# Patient Record
Sex: Male | Born: 1980 | Race: White | Hispanic: No | Marital: Married | Smoking: Current every day smoker
Health system: Southern US, Community
[De-identification: ages and names within clinical notes are randomized; demographics above are authoritative.]

## PROBLEM LIST (undated history)

## (undated) DIAGNOSIS — I499 Cardiac arrhythmia, unspecified: Secondary | ICD-10-CM

## (undated) DIAGNOSIS — R011 Cardiac murmur, unspecified: Secondary | ICD-10-CM

## (undated) DIAGNOSIS — A630 Anogenital (venereal) warts: Secondary | ICD-10-CM

## (undated) HISTORY — DX: Cardiac murmur, unspecified: R01.1

## (undated) HISTORY — DX: Anogenital (venereal) warts: A63.0

## (undated) HISTORY — DX: Cardiac arrhythmia, unspecified: I49.9

---

## 2013-07-26 ENCOUNTER — Encounter: Payer: Self-pay | Admitting: Internal Medicine

## 2013-07-26 ENCOUNTER — Ambulatory Visit (INDEPENDENT_AMBULATORY_CARE_PROVIDER_SITE_OTHER): Payer: Managed Care, Other (non HMO) | Admitting: Internal Medicine

## 2013-07-26 VITALS — BP 110/64 | HR 83 | Temp 98.8°F | Ht 72.5 in | Wt 177.0 lb

## 2013-07-26 DIAGNOSIS — Z0000 Encounter for general adult medical examination without abnormal findings: Secondary | ICD-10-CM

## 2013-07-26 NOTE — Patient Instructions (Addendum)

## 2013-07-26 NOTE — Progress Notes (Signed)
Pre visit review using our clinic review tool, if applicable. No additional management support is needed unless otherwise documented below in the visit note. 

## 2013-07-26 NOTE — Progress Notes (Signed)
HPI  Pt presents to the clinic today to establish care. Steven Hart not had a PCP in many years. Steven does reports Steven Hart an issue with his heart. 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 Steven was told at that time Steven had a Grade 4 systolic murmur. Steven was referred to a cardiologist but never went to the appointment. A few weeks ago, Steven was going to annual training for the [employer]. Steven was sprinting and about 1 minute after, Steven did feel like his heart was racing. Steven ended up going to the ER. His heart rate was elevated in the 180's. Steven does report that Steven had a elevated troponin, creatinine. Chest xray was normal. Steven was again referred to cardiology. Steven Hart an appointment in 2 weeks. Steven Hart not had another episode of heart racing since that time.  Flu: 11/2012 Tetanus: unsure Dentist: as needed  Past Medical History  Diagnosis Date  . Heart murmur   . Genital warts   . Arrhythmia     Current Outpatient Prescriptions  Medication Sig Dispense Refill  . Multiple Vitamin (MULTIVITAMIN) tablet Take 1 tablet by mouth daily.       No current facility-administered medications for this visit.    No Known Allergies  History reviewed. No pertinent family history.  History   Social History  . Marital Status: Married    Spouse Name: N/A    Number of Children: N/A  . Years of Education: N/A   Occupational History  . Not on file.   Social History Main Topics  . Smoking status: Current Every Day Smoker -- 1.00 packs/day    Types: Cigarettes  . Smokeless tobacco: Never Used  . Alcohol Use: Yes     Comment: moderate  . Drug Use: No  . Sexual Activity: Not on file   Other Topics Concern  . Not on file   Social History Narrative  . No narrative on file    ROS:  Constitutional: Denies fever, malaise, fatigue, headache or abrupt weight changes.  HEENT: Denies eye pain, eye redness, ear pain, ringing in the ears, wax buildup, runny nose, nasal congestion, bloody nose, or  sore throat. Respiratory: Denies difficulty breathing, shortness of breath, cough or sputum production.   Cardiovascular: Denies chest pain, chest tightness, palpitations or swelling in the hands or feet.  Gastrointestinal: Denies abdominal pain, bloating, constipation, diarrhea or blood in the stool.  GU: Denies frequency, urgency, pain with urination, blood in urine, odor or discharge. Musculoskeletal: Denies decrease in range of motion, difficulty with gait, muscle pain or joint pain and swelling.  Skin: Denies redness, rashes, lesions or ulcercations.  Neurological: Denies dizziness, difficulty with memory, difficulty with speech or problems with balance and coordination.   No other specific complaints in a complete review of systems (except as listed in HPI above).  PE:  BP 110/64  Pulse 83  Temp(Src) 98.8 F (37.1 C) (Oral)  Ht 6' 0.5" (1.842 m)  Wt 177 lb (80.287 kg)  BMI 23.66 kg/m2  SpO2 98% Wt Readings from Last 3 Encounters:  07/26/13 177 lb (80.287 kg)    General: Appears his stated age, well developed, well nourished in NAD. HEENT: Head: normal shape and size; Eyes: sclera white, no icterus, conjunctiva pink, PERRLA and EOMs intact; Ears: Tm's gray and intact, normal light reflex; Nose: mucosa pink and moist, septum midline; Throat/Mouth: Teeth present, mucosa pink and moist, no lesions or ulcerations noted.  Neck: Normal range of  motion. Neck supple, trachea midline. No massses, lumps or thyromegaly present.  Cardiovascular: Normal rate and rhythm. S1,S2 noted.  No murmur, rubs or gallops noted. No JVD or BLE edema. No carotid bruits noted. Pulmonary/Chest: Normal effort and positive vesicular breath sounds. No respiratory distress. No wheezes, rales or ronchi noted.  Abdomen: Soft and nontender. Normal bowel sounds, no bruits noted. No distention or masses noted. Liver, spleen and kidneys non palpable. Musculoskeletal: Normal range of motion. No signs of joint swelling.  No difficulty with gait.  Neurological: Alert and oriented. Cranial nerves II-XII intact. Coordination normal. +DTRs bilaterally. Psychiatric: Mood and affect normal. Behavior is normal. Judgment and thought content normal.      Assessment and Plan:  Preventative Health Maintenance:  Will obtain basic screening labs today  Heart Murmur and Arrythmia:  Hart an upcoming appt with cardiology  RTC in 1 year or sooner if needed

## 2013-07-27 LAB — COMPREHENSIVE METABOLIC PANEL
ALBUMIN: 5.1 g/dL (ref 3.5–5.2)
ALT: 16 U/L (ref 0–53)
AST: 18 U/L (ref 0–37)
Alkaline Phosphatase: 45 U/L (ref 39–117)
BUN: 13 mg/dL (ref 6–23)
CO2: 26 mEq/L (ref 19–32)
Calcium: 9.6 mg/dL (ref 8.4–10.5)
Chloride: 107 mEq/L (ref 96–112)
Creatinine, Ser: 1 mg/dL (ref 0.4–1.5)
GFR: 91.8 mL/min (ref >60.00)
Glucose, Bld: 65 mg/dL — ABNORMAL LOW (ref 70–99)
POTASSIUM: 4.1 meq/L (ref 3.5–5.1)
Sodium: 139 mEq/L (ref 135–145)
Total Bilirubin: 0.5 mg/dL (ref 0.2–1.2)
Total Protein: 7.4 g/dL (ref 6.0–8.3)

## 2013-07-27 LAB — CBC
HCT: 44.6 % (ref 39.0–52.0)
Hemoglobin: 15.2 g/dL (ref 13.0–17.0)
MCHC: 34.2 g/dL (ref 30.0–36.0)
MCV: 94.3 fl (ref 78.0–100.0)
PLATELETS: 278 10*3/uL (ref 150.0–400.0)
RBC: 4.73 Mil/uL (ref 4.22–5.81)
RDW: 12.9 % (ref 11.5–15.5)
WBC: 8.4 10*3/uL (ref 4.0–10.5)

## 2013-07-27 LAB — LIPID PANEL
CHOL/HDL RATIO: 4
Cholesterol: 178 mg/dL (ref 0–200)
HDL: 49.3 mg/dL (ref >39.00)
LDL Cholesterol: 104 mg/dL — ABNORMAL HIGH (ref 0–99)
NonHDL: 128.7
Triglycerides: 122 mg/dL (ref 0.0–149.0)
VLDL: 24.4 mg/dL (ref 0.0–40.0)

## 2016-10-20 ENCOUNTER — Encounter: Payer: Self-pay | Admitting: Emergency Medicine

## 2016-10-20 ENCOUNTER — Emergency Department
Admission: EM | Admit: 2016-10-20 | Discharge: 2016-10-20 | Disposition: A | Discharge status: Home / Self Care | Payer: Managed Care, Other (non HMO) | Attending: Emergency Medicine | Admitting: Emergency Medicine

## 2016-10-20 ENCOUNTER — Emergency Department: Payer: Managed Care, Other (non HMO)

## 2016-10-20 DIAGNOSIS — Z79899 Other long term (current) drug therapy: Secondary | ICD-10-CM | POA: Insufficient documentation

## 2016-10-20 DIAGNOSIS — M6789 Other specified disorders of synovium and tendon, multiple sites: Secondary | ICD-10-CM

## 2016-10-20 DIAGNOSIS — S61214A Laceration without foreign body of right ring finger without damage to nail, initial encounter: Secondary | ICD-10-CM | POA: Insufficient documentation

## 2016-10-20 DIAGNOSIS — Y929 Unspecified place or not applicable: Secondary | ICD-10-CM | POA: Diagnosis not present

## 2016-10-20 DIAGNOSIS — S61411A Laceration without foreign body of right hand, initial encounter: Secondary | ICD-10-CM

## 2016-10-20 DIAGNOSIS — W269XXA Contact with unspecified sharp object(s), initial encounter: Secondary | ICD-10-CM | POA: Insufficient documentation

## 2016-10-20 DIAGNOSIS — M6788 Other specified disorders of synovium and tendon, other site: Secondary | ICD-10-CM | POA: Insufficient documentation

## 2016-10-20 DIAGNOSIS — Y999 Unspecified external cause status: Secondary | ICD-10-CM | POA: Insufficient documentation

## 2016-10-20 DIAGNOSIS — Y93G2 Activity, grilling and smoking food: Secondary | ICD-10-CM | POA: Diagnosis not present

## 2016-10-20 DIAGNOSIS — F17210 Nicotine dependence, cigarettes, uncomplicated: Secondary | ICD-10-CM | POA: Insufficient documentation

## 2016-10-20 MED ORDER — LIDOCAINE HCL 1 % IJ SOLN
5.0000 mL | Freq: Once | INTRAMUSCULAR | Status: DC
  Filled 2016-10-20: qty 5

## 2016-10-20 MED ORDER — CEPHALEXIN 500 MG PO CAPS: 500.0000 mg | ORAL_CAPSULE | Freq: Four times a day (QID) | ORAL | 0 refills | Status: AC

## 2016-10-20 MED ORDER — LIDOCAINE HCL (PF) 1 % IJ SOLN
INTRAMUSCULAR | Status: AC
  Filled 2016-10-20: qty 5

## 2016-10-20 NOTE — ED Provider Notes (Signed)
[facility] Emergency Department Provider Note  ____________________________________________  Time seen: Approximately 10:30 PM  I have reviewed the triage vital signs and the nursing notes.   HISTORY  Chief Complaint Laceration     HPI Steven Hart is a 36 y.o. male presenting to the emergency department with a linear, 8 cm laceration along the ulnar aspect of the right ring finger. Patient states that he sustained laceration while cooking on a charcoal grill this evening. Hemostasis was achieved prior to presenting to the emergency department. Patient denies weakness, radiculopathy or changes in sensation of the right upper extremity. Patient confides that he is under the influence of alcohol.    Past Medical History:  Diagnosis Date  . Arrhythmia   . Genital warts   . Heart murmur     There are no active problems to display for this patient.   History reviewed. No pertinent surgical history.  Prior to Admission medications   Medication Sig Start Date End Date Taking? Authorizing Provider  cephALEXin (KEFLEX) 500 MG capsule Take 1 capsule (500 mg total) by mouth 4 (four) times daily. 10/20/16 10/30/16  Orvil FeilWoods, Jaclyn M, PA-C  Multiple Vitamin (MULTIVITAMIN) tablet Take 1 tablet by mouth daily.    [provider]    Allergies Patient has no known allergies.  Family History  Problem Relation Age of Onset  . Heart disease Maternal Grandmother   . Heart disease Maternal Grandfather   . Cancer Neg Hx   . Diabetes Neg Hx   . Stroke Neg Hx     Social History Social History  Substance Use Topics  . Smoking status: Current Every Day Smoker    Packs/day: 1.00    Types: Cigarettes  . Smokeless tobacco: Never Used  . Alcohol use 0.6 oz/week    1 Cans of beer per week     Comment: moderate     Review of Systems  Constitutional: No fever/chills Eyes: No visual changes. No discharge ENT: No upper respiratory complaints. Cardiovascular:  no chest pain. Respiratory: no cough. No SOB. Gastrointestinal: No abdominal pain.  No nausea, no vomiting.  No diarrhea.  No constipation. Musculoskeletal: Negative for musculoskeletal pain. Skin: Patient has laceration  Neurological: Negative for headaches, focal weakness or numbness.   ____________________________________________   PHYSICAL EXAM:  VITAL SIGNS: ED Triage Vitals  Enc Vitals Group     BP 10/20/16 2156 131/88     Pulse Rate 10/20/16 2156 100     Resp 10/20/16 2156 17     Temp --      Temp src --      SpO2 10/20/16 2156 99 %     Weight 10/20/16 2148 177 lb (80.3 kg)     Height --      Head Circumference --      Peak Flow --      Pain Score --      Pain Loc --      Pain Edu? --      Excl. in GC? --      Constitutional: Alert and oriented. Well appearing and in no acute distress. Eyes: Conjunctivae are normal. PERRL. EOMI. Head: Atraumatic. Cardiovascular: Normal rate, regular rhythm. Normal S1 and S2.  Good peripheral circulation. Respiratory: Normal respiratory effort without tachypnea or retractions. Lungs CTAB. Good air entry to the bases with no decreased or absent breath sounds. Musculoskeletal: Patient has 5 out of 5 strength in the upper extremities bilaterally. Patient is able to move all 5 right  fingers. Patient is able to perform limited extension at the right ring finger. Patient's extensor tendon of right ring finger appears lacerated. Palpable radial pulse, right. Neurologic:  Normal speech and language. No gross focal neurologic deficits are appreciated.  Skin: Patient has 8 cm linear laceration along right ring finger. Psychiatric: Mood and affect are normal. Speech and behavior are normal. Patient exhibits appropriate insight and judgement.   ____________________________________________   LABS (all labs ordered are listed, but only abnormal results are displayed)  Labs Reviewed - No data to  display ____________________________________________  EKG   ____________________________________________  RADIOLOGY  Steven Hart, personally viewed and evaluated these images (plain radiographs) as part of my medical decision making, as well as reviewing the written report by the radiologist.  Dg Hand Complete Right  Result Date: 10/20/2016 CLINICAL DATA:  Laceration to the right hand from a coronal this evening. Cut is between the middle and ring fingers extending to the knuckle. EXAM: RIGHT HAND - COMPLETE 3+ VIEW COMPARISON:  None. FINDINGS: Overlying gauze material may obscure some detail. Right hand appears intact. No evidence of acute fracture or subluxation. No focal bone lesion or bone destruction. Bone cortex and trabecular architecture appear intact. No radiopaque soft tissue foreign bodies. IMPRESSION: No acute bony abnormalities. No radiopaque soft tissue foreign bodies identified. Electronically Signed   By: Burman Nieves M.D.   On: 10/20/2016 22:18    ____________________________________________    PROCEDURES  Procedure(s) performed:    Procedures  LACERATION REPAIR Performed by: Orvil Feil Authorized by: Orvil Feil Consent: Verbal consent obtained. Risks and benefits: risks, benefits and alternatives were discussed Consent given by: patient Patient identity confirmed: provided demographic data Patient's wound was irrigated with 500 mL of normal saline and prepped with Betadine.  Wound explored  Laceration Location: Right Ring Finger  Laceration Length: 8 cm  No Foreign Bodies seen or palpated  Anesthesia: local infiltration  Local anesthetic: lidocaine 1% without epinephrine  Anesthetic total: 8 ml  Irrigation method: syringe Amount of cleaning: standard  Skin closure: 4-0 Ethilon   Number of sutures: 11  Technique: Simple Interrupted   Patient tolerance: Patient tolerated the procedure well with no immediate  complications.   Medications  lidocaine (XYLOCAINE) 1 % (with pres) injection 5 mL (not administered)     ____________________________________________   INITIAL IMPRESSION / ASSESSMENT AND PLAN / ED COURSE  Pertinent labs & imaging results that were available during my care of the patient were reviewed by me and considered in my medical decision making (see chart for details).  Review of the [place] CSRS was performed in accordance of the [facility] prior to dispensing any controlled drugs.     Assessment and Plan:  Right ring finger laceration Extensor tendon disruption Patient presents the emergency department with an 8 cm laceration of the right ring finger. As patient has possible lacerated extensor tendon, Dr. Ernest Pine, the orthopedist on-call, was consulted. Dr. Ernest Pine recommended reparing laceration with suture and having patient follow up with him in the office. Patient is under the influence of alcohol and has eaten recently. Patient was discharged with Keflex. Vital signs are reassuring prior to discharge. All patient questions were answered. Specific instructions were given to patient regarding follow-up with Dr. Ernest Pine. Patient was advised to make an appointment on 10/21/2016. Patient voiced understanding.    ____________________________________________  FINAL CLINICAL IMPRESSION(S) / ED DIAGNOSES  Final diagnoses:  Laceration of right hand without foreign body, initial encounter  Extensor tendon disruption  NEW MEDICATIONS STARTED DURING THIS VISIT:  New Prescriptions   CEPHALEXIN (KEFLEX) 500 MG CAPSULE    Take 1 capsule (500 mg total) by mouth 4 (four) times daily.        This chart was dictated using voice recognition software/Dragon. Despite best efforts to proofread, errors can occur which can change the meaning. Any change was purely unintentional.        Orvil Feil, PA-C 10/20/16 2330    Dionne Bucy, MD 10/21/16 [id number]

## 2016-10-20 NOTE — ED Notes (Addendum)
Upon discharging pt, room found to be empty w/ no signs of any left belongings.  Pt has left prior to receive discharge paper work which include antibiotic prescription and orthopedic surgery follow up information.  This RN attempted to get in touch with pt via telephone, no answer on home phone or cell phone of pt's emergency contact.  Voicemail left with pt's emergency contact and spouse, Harvest ForestMelanie Basford.  Discharge paperwork placed in envelope and labeled w/ pt sticker; given to charge RN at this time.

## 2016-10-20 NOTE — ED Triage Notes (Addendum)
Pt reports he cut his hand on a charcoal grill this evening. Pt has an approximate a 2 inch laceration in between the middle and ring finger. Tissue exposed on assessment. Pressure dressing applied to area. Pt admits to ETOH use tonight. Pt last had food/drink at 2200.

## 2019-04-01 IMAGING — DX DG HAND COMPLETE 3+V*R*
3 series · 3 of 3 positions shown · non-contrast
Comparison: None.

CLINICAL DATA: Laceration to the right hand from a coronal this
evening. Cut is between the middle and ring fingers extending to the
knuckle.

EXAM:
RIGHT HAND - COMPLETE 3+ VIEW

[hand ap]
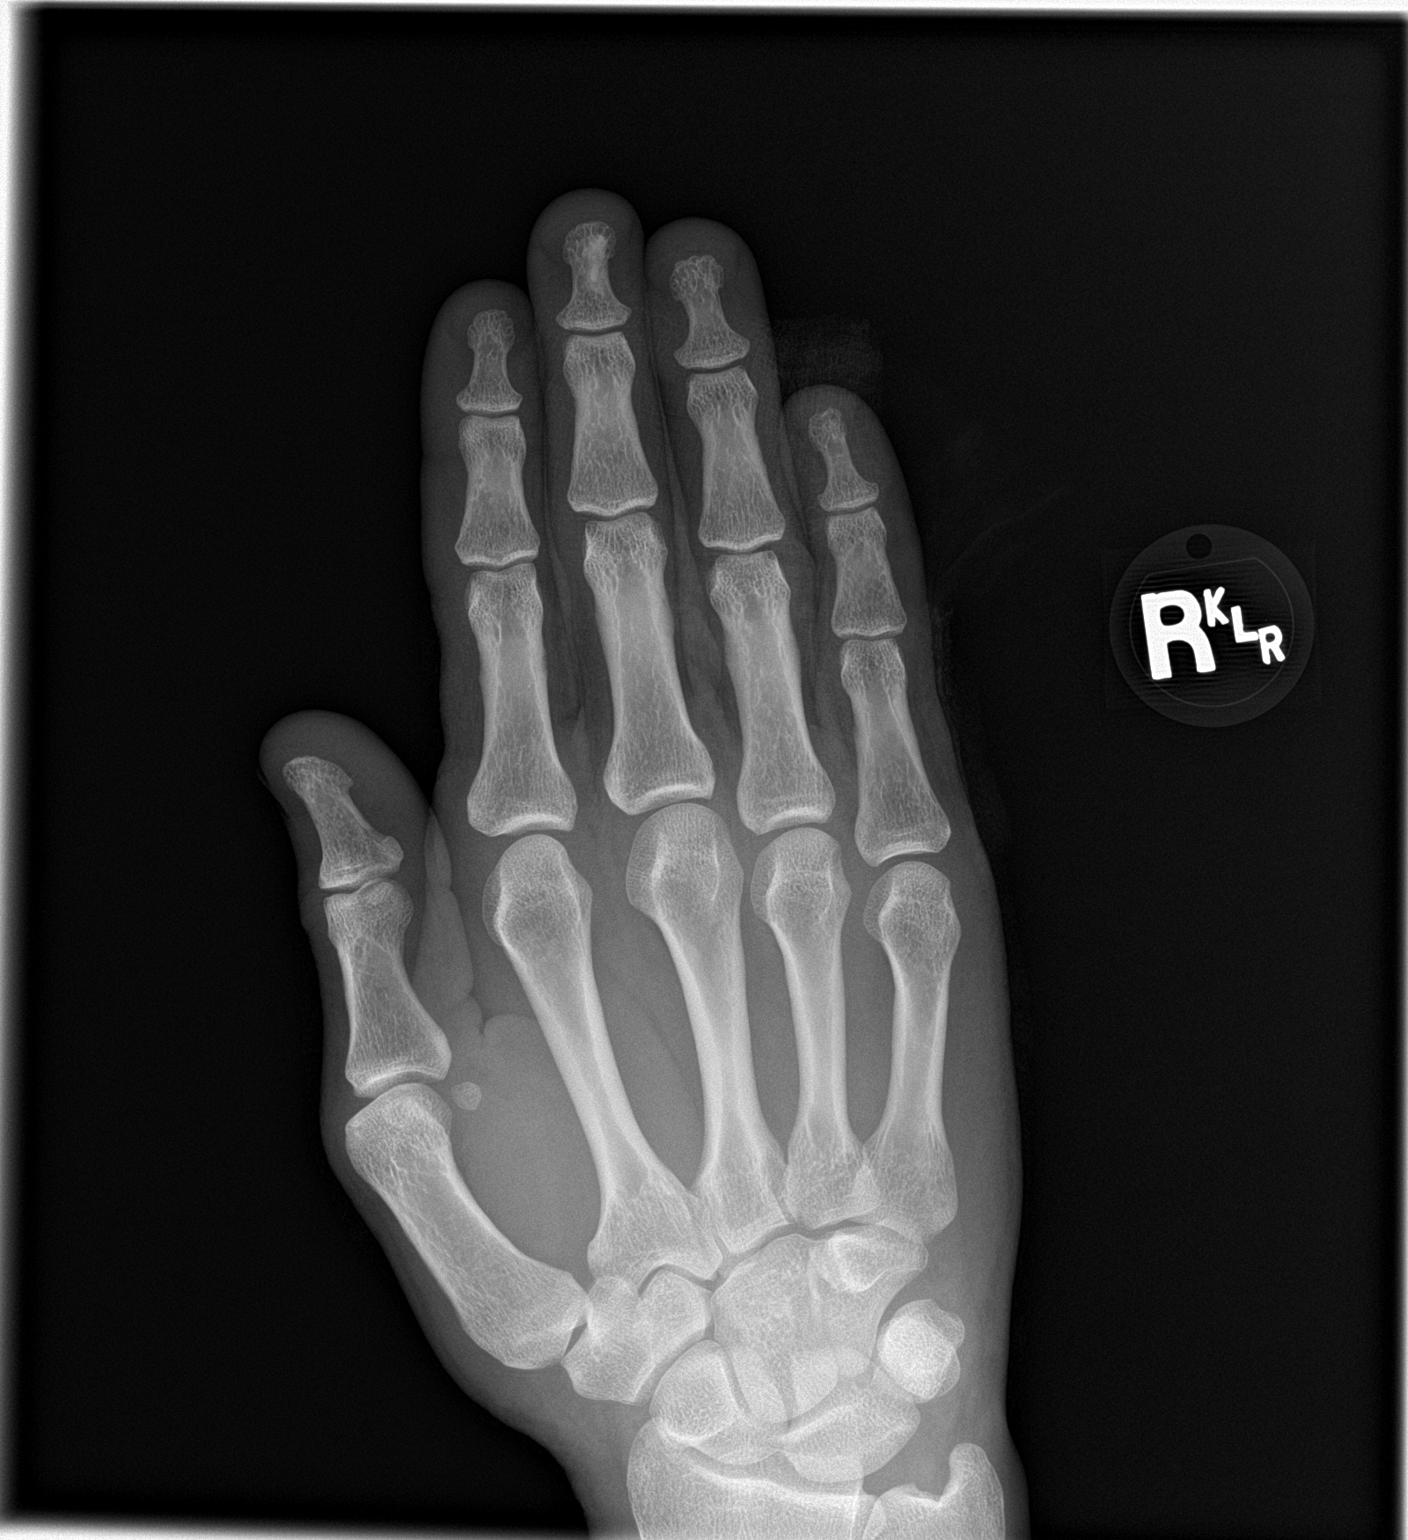

[hand obl]
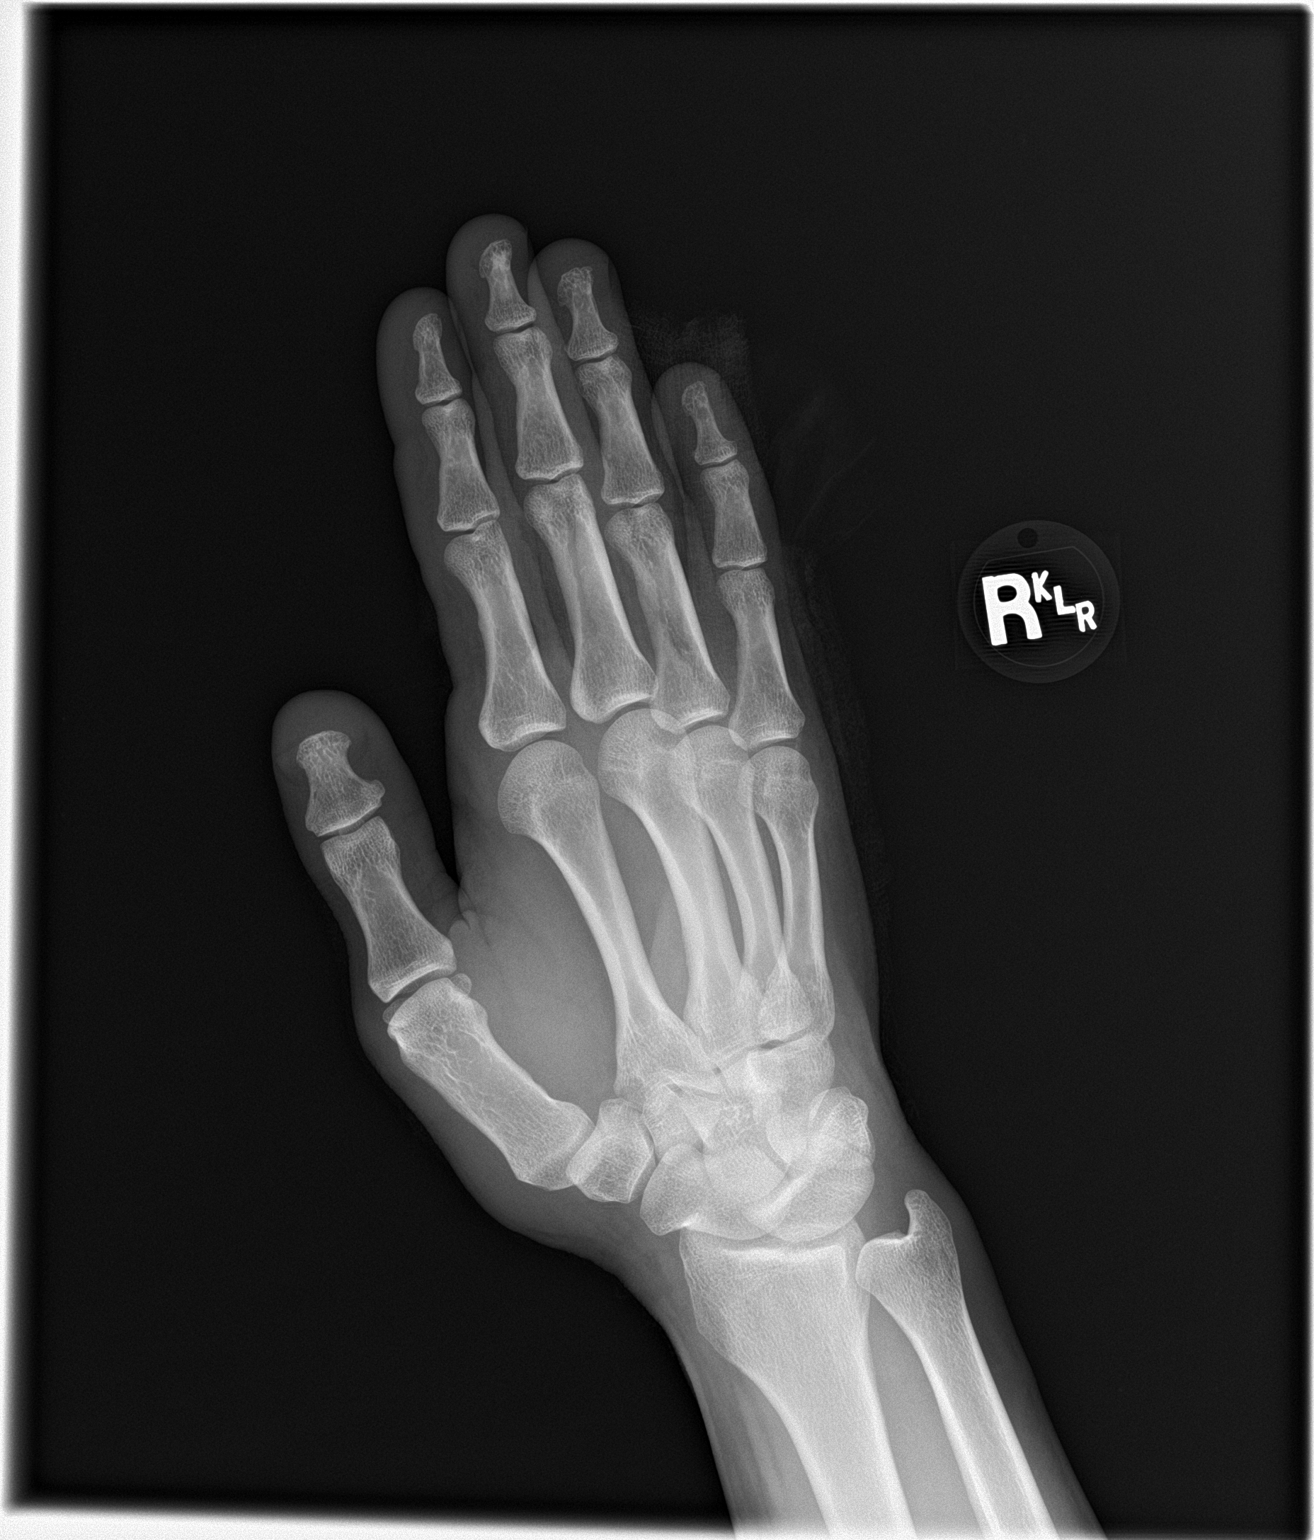

[hand lat]
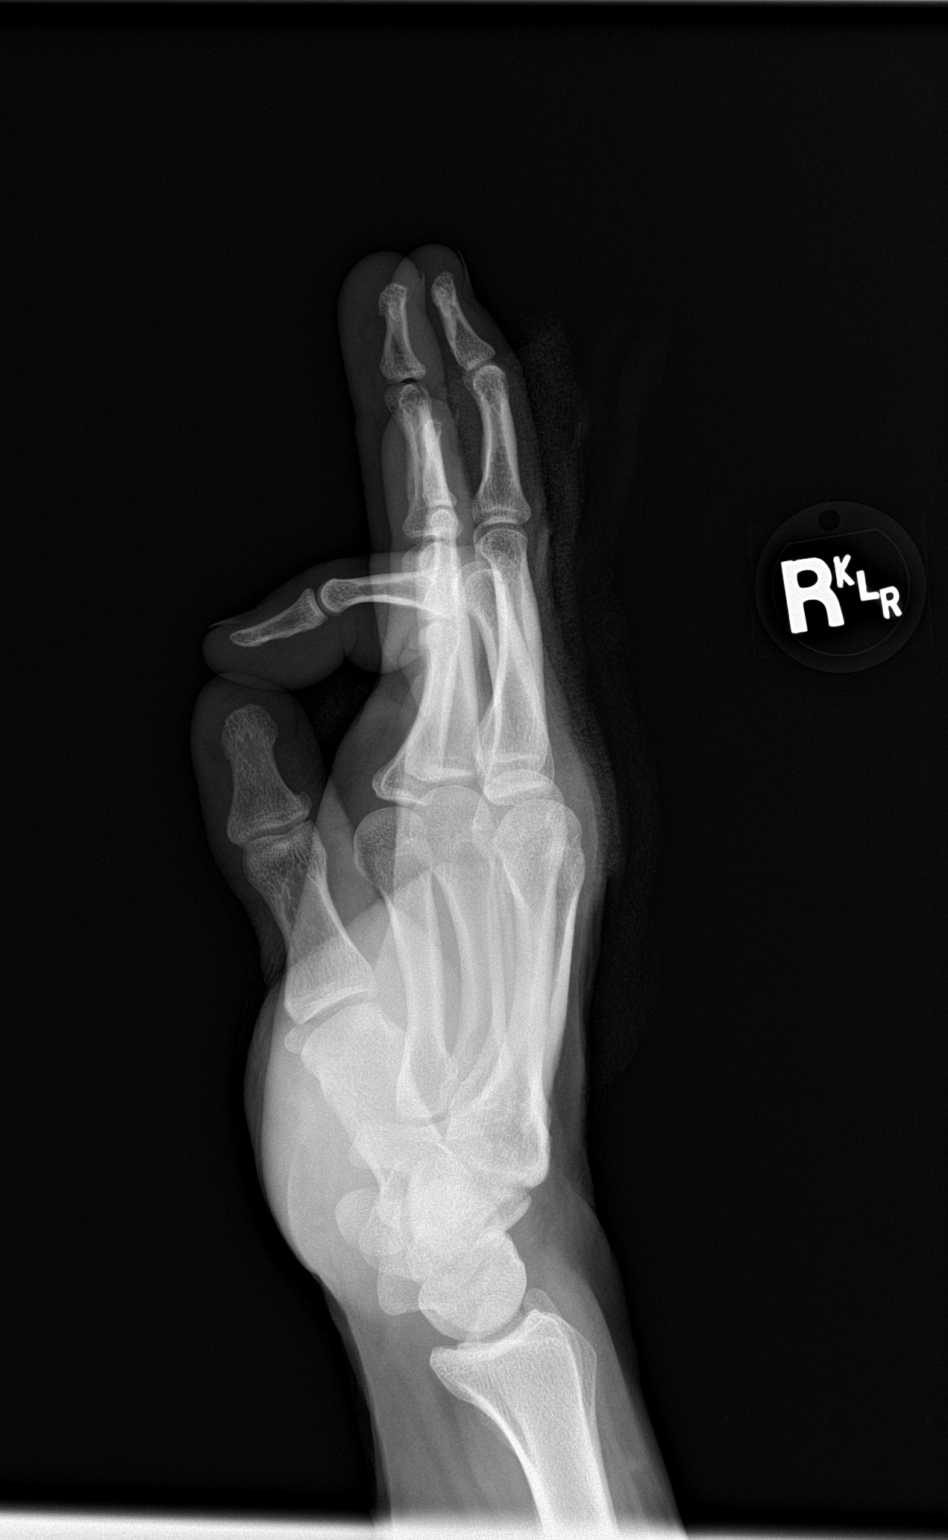

[3 of 3 positions shown; findings below may reference images not displayed]

FINDINGS: Overlying gauze material may obscure some detail. Right hand appears
intact. No evidence of acute fracture or subluxation. No focal bone
lesion or bone destruction. Bone cortex and trabecular architecture
appear intact. No radiopaque soft tissue foreign bodies.
IMPRESSION: No acute bony abnormalities. No radiopaque soft tissue foreign
bodies identified.
